# Patient Record
Sex: Female | Born: 1970 | Race: White | Hispanic: No | Marital: Married | Smoking: Never smoker
Health system: Southern US, Community
[De-identification: ages and names within clinical notes are randomized; demographics above are authoritative.]

## PROBLEM LIST (undated history)

## (undated) HISTORY — PX: DILATION AND EVACUATION: SHX1459

## (undated) HISTORY — PX: WISDOM TOOTH EXTRACTION: SHX21

## (undated) HISTORY — PX: BREAST CYST EXCISION: SHX579

---

## 1999-01-22 ENCOUNTER — Other Ambulatory Visit: Admission: RE | Admit: 1999-01-22 | Discharge: 1999-01-22 | Payer: Self-pay | Admitting: Family Medicine

## 2000-01-15 ENCOUNTER — Other Ambulatory Visit: Admission: RE | Admit: 2000-01-15 | Discharge: 2000-01-15 | Payer: Self-pay | Admitting: Family Medicine

## 2000-06-05 ENCOUNTER — Encounter: Payer: Self-pay | Admitting: Family Medicine

## 2000-06-05 ENCOUNTER — Encounter: Admission: RE | Admit: 2000-06-05 | Discharge: 2000-06-05 | Payer: Self-pay | Admitting: Family Medicine

## 2000-12-07 ENCOUNTER — Encounter: Payer: Self-pay | Admitting: Family Medicine

## 2000-12-07 ENCOUNTER — Encounter: Admission: RE | Admit: 2000-12-07 | Discharge: 2000-12-07 | Payer: Self-pay | Admitting: Family Medicine

## 2000-12-18 ENCOUNTER — Ambulatory Visit (HOSPITAL_COMMUNITY): Admission: RE | Admit: 2000-12-18 | Discharge: 2000-12-18 | Payer: Self-pay | Admitting: Obstetrics and Gynecology

## 2000-12-18 ENCOUNTER — Encounter (INDEPENDENT_AMBULATORY_CARE_PROVIDER_SITE_OTHER): Payer: Self-pay | Admitting: Specialist

## 2001-03-01 ENCOUNTER — Other Ambulatory Visit: Admission: RE | Admit: 2001-03-01 | Discharge: 2001-03-01 | Payer: Self-pay | Admitting: Obstetrics and Gynecology

## 2001-06-03 ENCOUNTER — Encounter: Payer: Self-pay | Admitting: Family Medicine

## 2001-06-03 ENCOUNTER — Encounter: Admission: RE | Admit: 2001-06-03 | Discharge: 2001-06-03 | Payer: Self-pay | Admitting: Family Medicine

## 2001-12-31 ENCOUNTER — Inpatient Hospital Stay (HOSPITAL_COMMUNITY): Admission: AD | Admit: 2001-12-31 | Discharge: 2002-01-03 | Payer: Self-pay | Admitting: Obstetrics and Gynecology

## 2002-01-27 ENCOUNTER — Other Ambulatory Visit: Admission: RE | Admit: 2002-01-27 | Discharge: 2002-01-27 | Payer: Self-pay | Admitting: Obstetrics and Gynecology

## 2003-01-17 ENCOUNTER — Other Ambulatory Visit: Admission: RE | Admit: 2003-01-17 | Discharge: 2003-01-17 | Payer: Self-pay | Admitting: Obstetrics and Gynecology

## 2004-02-22 ENCOUNTER — Other Ambulatory Visit: Admission: RE | Admit: 2004-02-22 | Discharge: 2004-02-22 | Payer: Self-pay | Admitting: Obstetrics and Gynecology

## 2005-04-01 ENCOUNTER — Other Ambulatory Visit: Admission: RE | Admit: 2005-04-01 | Discharge: 2005-04-01 | Payer: Self-pay | Admitting: Obstetrics and Gynecology

## 2005-04-11 ENCOUNTER — Encounter: Admission: RE | Admit: 2005-04-11 | Discharge: 2005-04-11 | Payer: Self-pay | Admitting: Obstetrics and Gynecology

## 2006-05-06 ENCOUNTER — Encounter: Admission: RE | Admit: 2006-05-06 | Discharge: 2006-05-06 | Payer: Self-pay | Admitting: Obstetrics and Gynecology

## 2006-06-01 ENCOUNTER — Encounter (INDEPENDENT_AMBULATORY_CARE_PROVIDER_SITE_OTHER): Payer: Self-pay | Admitting: *Deleted

## 2006-06-01 ENCOUNTER — Ambulatory Visit (HOSPITAL_BASED_OUTPATIENT_CLINIC_OR_DEPARTMENT_OTHER): Admission: RE | Admit: 2006-06-01 | Discharge: 2006-06-01 | Payer: Self-pay | Admitting: General Surgery

## 2007-08-18 ENCOUNTER — Inpatient Hospital Stay (HOSPITAL_COMMUNITY): Admission: AD | Admit: 2007-08-18 | Discharge: 2007-08-18 | Payer: Self-pay | Admitting: Obstetrics and Gynecology

## 2007-10-06 ENCOUNTER — Encounter (INDEPENDENT_AMBULATORY_CARE_PROVIDER_SITE_OTHER): Payer: Self-pay | Admitting: Obstetrics and Gynecology

## 2007-10-06 ENCOUNTER — Ambulatory Visit (HOSPITAL_COMMUNITY): Admission: RE | Admit: 2007-10-06 | Discharge: 2007-10-06 | Payer: Self-pay | Admitting: Obstetrics and Gynecology

## 2010-08-22 ENCOUNTER — Encounter: Admission: RE | Admit: 2010-08-22 | Discharge: 2010-08-22 | Payer: Self-pay | Admitting: Obstetrics and Gynecology

## 2011-03-23 IMAGING — MG MM DIGITAL SCREENING
4 series · 4 of 4 positions shown · non-contrast
Comparison: none

DG SCREEN MAMMOGRAM BILATERAL
Bilateral CC and MLO view(s) were taken.

DIGITAL SCREENING MAMMOGRAM WITH CAD:
The breast tissue is extremely dense.  No masses or malignant type calcifications are identified.  
Compared with prior studies.
Images were processed with CAD.

[R CC]
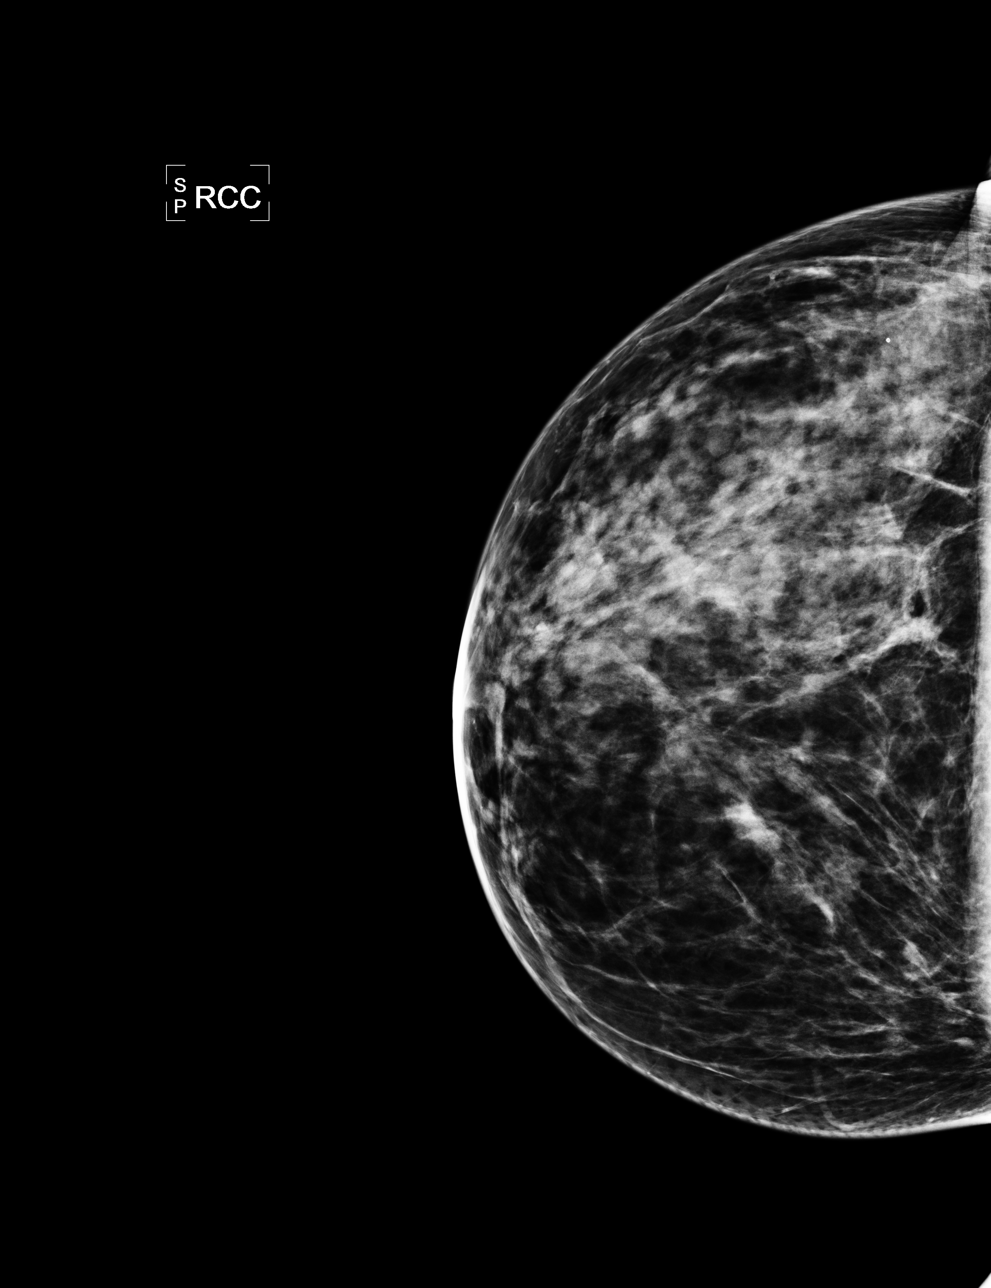

[L CC]
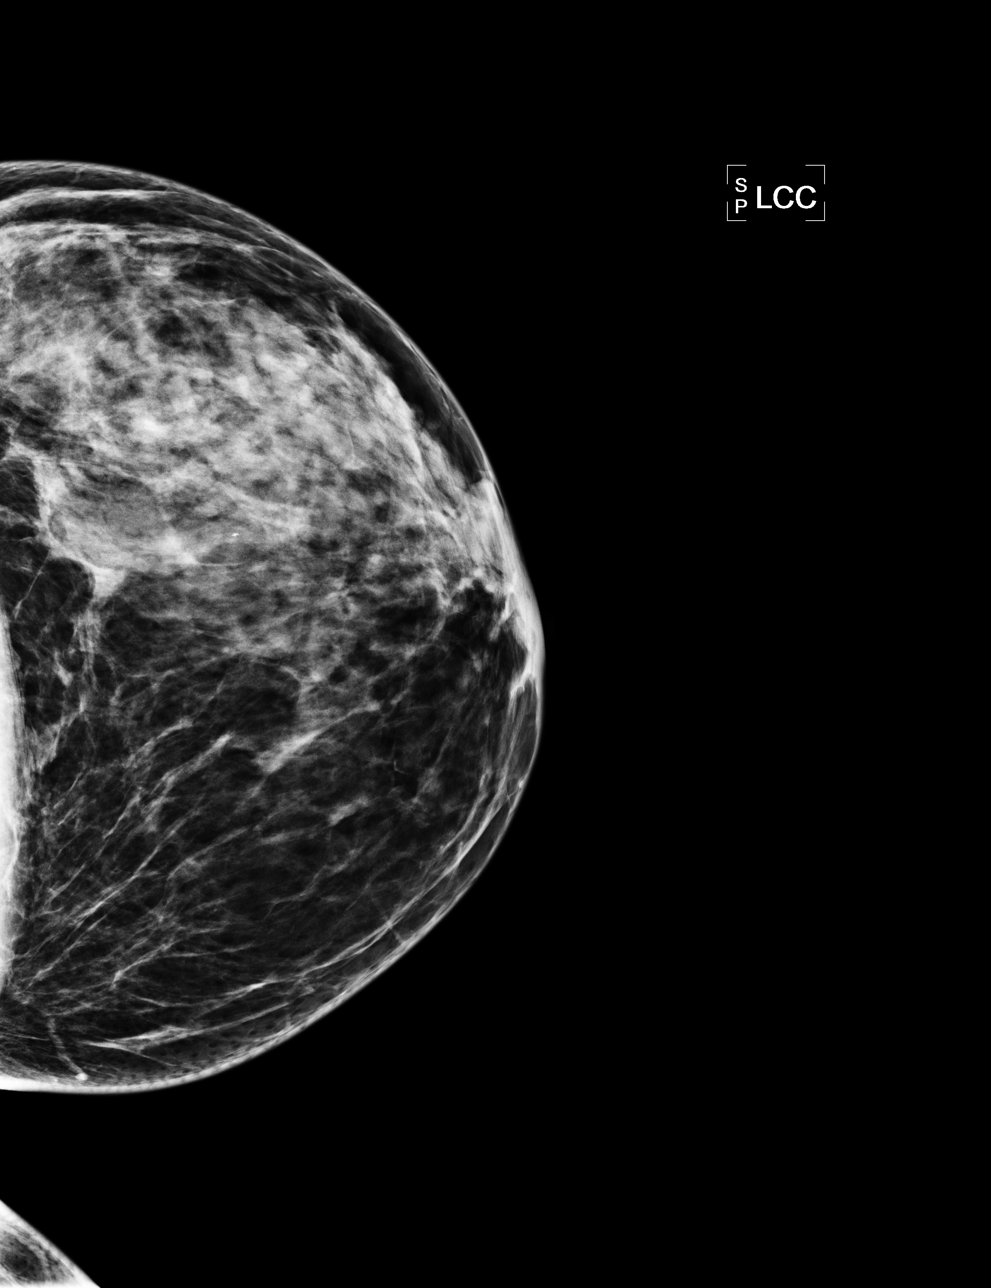

[L MLO]
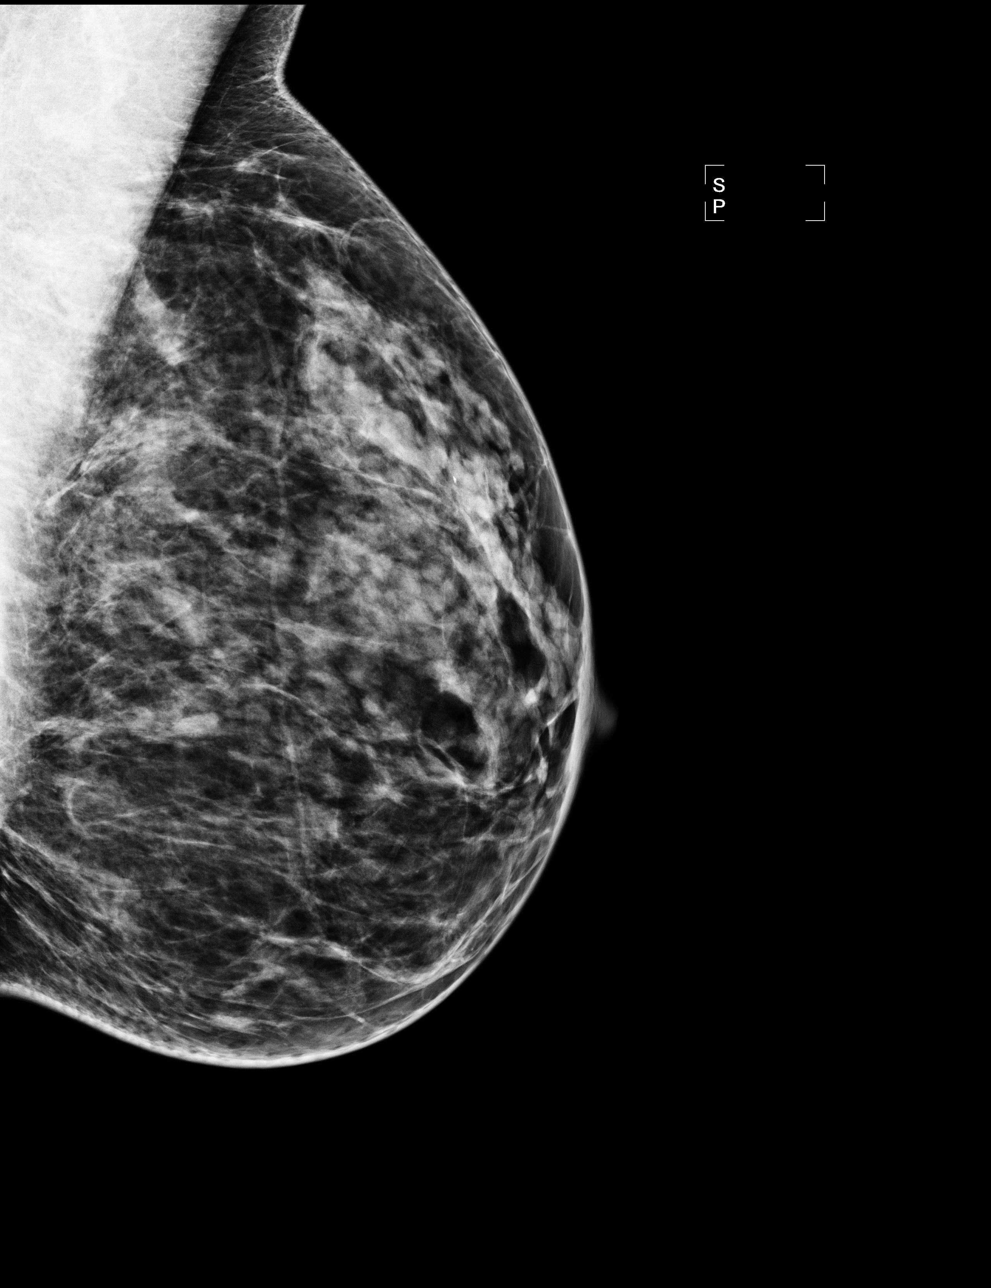

[R MLO]
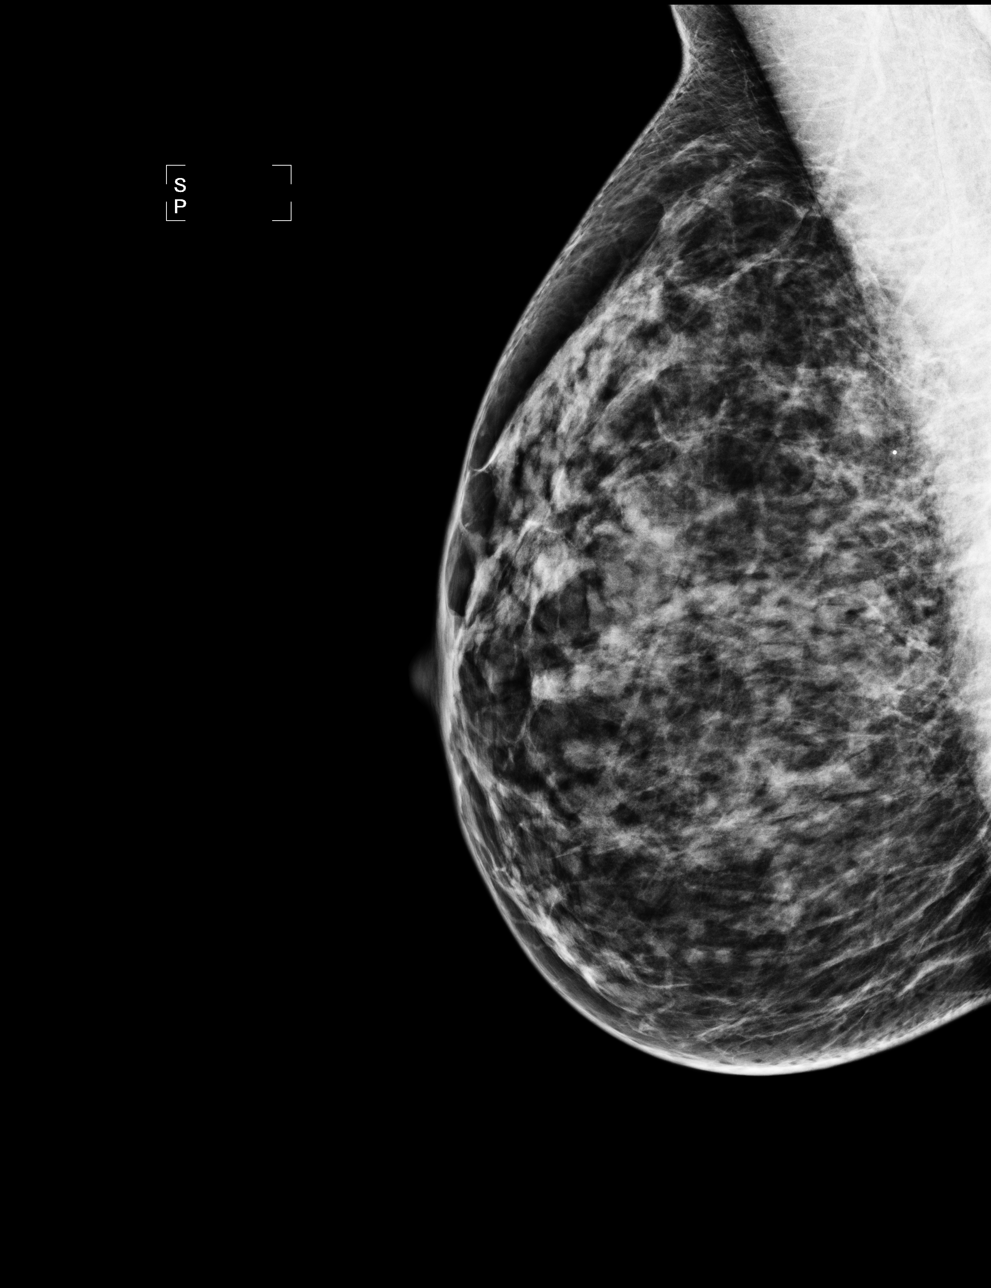

[4 of 4 positions shown; findings below may reference images not displayed]

IMPRESSION: No specific mammographic evidence of malignancy.  Next screening mammogram is recommended in one 
year.

A result letter of this screening mammogram will be mailed directly to the patient.

ASSESSMENT: Negative - BI-RADS 1

Screening mammogram in 1 year.
,

## 2011-04-01 NOTE — Op Note (Signed)
Kristina Lamb, Kristina Lamb              ACCOUNT NO.:  [id number]   MEDICAL RECORD NO.:  [id number]          PATIENT TYPE:  AMB   LOCATION:  SDC                           FACILITY:  [facility]   PHYSICIAN:  Dineen Kid. Rana Snare, M.D.    DATE OF BIRTH:  May 08, 1971   DATE OF PROCEDURE:  10/06/2007  DATE OF DISCHARGE:                               OPERATIVE REPORT   PREOPERATIVE DIAGNOSIS:  Pregnancy at 8 weeks with embryonic demise.   POSTOPERATIVE DIAGNOSIS:  Pregnancy at 8 weeks with embryonic demise.   PROCEDURE:  Dilation and evacuation.   SURGEON:  Dineen Kid. Rana Snare, M.D.   ANESTHESIA:  Monitored anesthetic care and paracervical block.   INDICATIONS:  [name] is a 39 year old G3, P1, pregnancy conceived on  fertility medication, underwent ultrasound last week which showed size  less than dates.  Ultrasound yesterday confirmed embryonic demise with  the fetus measuring 6 1/2 weeks and no fetal heart rate.  The patient  presents today for dilation and evacuation.  The risks and benefits were  discussed and informed consent was obtained. Her blood type is O  positive.   DESCRIPTION OF PROCEDURE:  After adequate analgesia, the patient was  placed in the dorsal lithotomy position.  She is sterilely prepped and  draped.  The bladder was sterilely drained.  A Graves speculum was  placed.  A tenaculum was placed on the anterior lip of the cervix.  A  paracervical block was placed.  1% Xylocaine with 1:100,000 epinephrine,  a total of 20 mL were used.  The uterus sounded to 10 cm, easily dilated  to a #27 Pratt dilator, 8 mm suction curette was inserted.  Products of  conception retrieved.  Curettage performed until a gritty surface was  felt throughout the endometrial cavity and no products of conception  appeared to be remaining. The patient was then given 0.2 mg Methergine  IM with good uterine response.  The curette was then removed.  The  tenaculum was removed from the anterior lip of the cervix and  noted to  be hemostatic.  The patient was then transferred to the recovery room in  stable condition.  Sponge and instrument counts were reported as normal  x3.  Estimated blood loss was minimal.  The patient did receive Toradol  30 mg IV after the procedure.   DISPOSITION:  The patient will be discharged home.  Follow up in the  office in 2-3 weeks.  She is sent home with a routine instruction sheet  for D&E.  She is told to return for increased pain, fever or bleeding.  Also sent home with a prescription for doxycycline 100 mg p.o. b.i.d. x7  days and Methergine 0.2 mg, take every 8 hours for 2 days.      Dineen Kid Rana Snare, M.D.  Electronically Signed     DCL/MEDQ  D:  10/06/2007  T:  10/06/2007  Job:  [id number]

## 2011-04-04 NOTE — Op Note (Signed)
Kristina Lamb, YAHR              ACCOUNT NO.:  [id number]   MEDICAL RECORD NO.:  [id number]          PATIENT TYPE:  AMB   LOCATION:  DSC                          FACILITY:  [facility]   PHYSICIAN:  Ollen Gross. [name], M.D. DATE OF BIRTH:  [birth year]   DATE OF PROCEDURE:  06/01/2006  DATE OF DISCHARGE:                                 OPERATIVE REPORT   PREOPERATIVE DIAGNOSIS:  Right breast mass.   POSTOPERATIVE DIAGNOSIS:  Right breast mass.   PROCEDURE:  Right breast lumpectomy.   SURGEON:  Dr. Carolynne Edouard.   ANESTHESIA:  General via LMA.   PROCEDURE:  After informed consent was obtained, the patient was brought to  the operating room and placed in supine position on the operating table.  After adequate induction of general anesthesia, the patient's right breast  was prepped with Betadine and draped in usual sterile manner.  The patient  had a palpable mass at the 12 o'clock position just at the edge of the  areola.  A small curvilinear incision was made on the superior edge of the  areola overlying the mass.  This incision was carried down through the skin  and subcutaneous tissue sharply with electrocautery.  Once into the fatty  breast tissue, the palpable mass was grasped with an Allis clamp, the mass  was separated from the rest of the fatty breast tissue sharply with  electrocautery.  Once the mass was removed, it was sent to pathology for  further evaluation. Hemostasis was achieved using Bovie electrocautery.  The  wound was irrigated with copious amounts of saline.  The deep layer of the  wound was then closed with interrupted 3-0 Vicryl stitches and the skin was  closed with a running for Monocryl subcuticular stitch. Benzoin, Steri-  Strips and sterile dressings were applied.  The wound was also infiltrated  with a 0.25% Marcaine.  The patient tolerated well.  At the end of the case,  all needle, sponge and instrument counts correct.  The patient was awakened,  taken to recovery  room in stable condition.      Ollen Gross. [name], M.D.  Electronically Signed     PST/MEDQ  D:  06/01/2006  T:  06/01/2006  Job:  [id number]

## 2011-04-04 NOTE — H&P (Signed)
[facility]  Patient:    Kristina Lamb, Kristina Lamb                       MRN: [id number] Attending:  Trevor Iha, M.D.                         History and Physical  DATE OF BIRTH:                10/02/1971  HISTORY OF PRESENT ILLNESS:   Ms. Kristina Lamb is a 40 year old, G1, P0, who presented to the office yesterday with vaginal bleeding and spotting.  She had a positive pregnancy test back at the beginning of December with a beta hCG of 115 on October 27, 2000, making her at least 7 weeks estimated gestational age.  An ultrasound yesterday showed a 5-1/2 week size fetal sac with no fetal pole or heart motion, consistent with missed abortion.  She presents for dilatation and evacuation.  Her estimated date of confinement by last menstrual period would be June 07, 2001.  Blood type is O+.  She is rubella immune.  The hemoglobin is 11.9.  She is HIV negative.  PAST MEDICAL HISTORY:         Negative.  PAST SURGICAL HISTORY:        Negative.  PAST GYNECOLOGICAL HISTORY:   No history of sexually transmitted diseases or abnormal Pap smears.  LABORATORY DATA:              Ultrasound shows a 6 week size uterus with 5-1/2 week size estimated gestational sac and no fetal pole.  PHYSICAL EXAMINATION:         Spotting is mild.  The cervix is close.  IMPRESSION AND PLAN:          Missed abortion, approximately 6 weeks estimated gestational size.  The risks and benefits were discussed at length for expectant management versus D&E.  The patient requests D&E.  Informed consent was obtained. DD:  12/18/00 TD:  12/18/00 Job: [id number]

## 2011-04-04 NOTE — Op Note (Signed)
[facility]  Patient:    LEAR, CARSTENS                       MRN: [id number] Proc. Date: 12/18/00 Adm. Date:  [id number] Disc. Date: [id number] Attending:  Trevor Iha                           Operative Report  PREOPERATIVE DIAGNOSIS:       Missed abortion, six weeks in size.  POSTOPERATIVE DIAGNOSIS:      Missed abortion, six weeks in size.  PROCEDURE:                    Dilation and evacuation.  SURGEON:                      Trevor Iha, M.D.  ANESTHESIA:                   Monitored anesthetic care and paracervical                               block.  INDICATIONS:                  Ms. Vanderloop is a 40 year old, G1, P0, at approximately 7 weeks estimated gestational age by last menstrual period and positive pregnancy test, with ultrasound consistent with approximately 6-week size missed abortion. She presents today for dilation and evacuation. See history and physical for the details. Her blood type is O positive. Informed consent was obtained.  DESCRIPTION OF PROCEDURE:     After adequate analgesia, the patient was placed in the dorsal lithotomy position. She was sterilely prepped and draped. The bladder was sterilely drained. Graves speculum was placed. A paracervical block was placed with 1% Xylocaine with 1:100,000 epinephrine and the anterior cervix was also injected with lidocaine. The uterus was sounded to 8 cm, dilated to a #23 Hegar dilator. A 7 mm suction curet was inserted and products of conception were retrieved. It was performed until the endometrial cavity was felt to be empty of the products of conception as evidenced by a gritty surface felt around the endometrial cavity, followed by a gentle, sharp curettage retrieving no retained products in a second pass of suction curettage reassuring the endometrial cavity was empty. At this time, the curet was removed. The tenaculum was removed from the cervix. A small  cervical laceration was noted from the tenaculum and was repaired with a figure-of-eight of 0 chromic. Good hemostasis was achieved. At this time, the speculum was removed. The patient tolerated the procedure well, was stable on transfer to recovery room. Sponge and instrument count was normal x 3. Estimated blood loss during the procedure was 10 cc.  DISPOSITION:                  The patient is to be discharged home with followup in the office in two to three weeks. She was sent home with a prescription for doxycycline 100 mg p.o. b.i.d. x 7 days and Methergine 0.2 mg to take every eight hours for two days. DD:  12/18/00 TD:  12/20/00 Job: [id number]

## 2011-08-26 LAB — CBC
HCT: 36.7
Hemoglobin: 12.7
MCHC: 34.7
Platelets: 250
RDW: 12.9

## 2019-02-15 ENCOUNTER — Ambulatory Visit (INDEPENDENT_AMBULATORY_CARE_PROVIDER_SITE_OTHER): Payer: BC Managed Care – PPO | Admitting: Psychology

## 2019-02-15 DIAGNOSIS — F4323 Adjustment disorder with mixed anxiety and depressed mood: Secondary | ICD-10-CM | POA: Diagnosis not present

## 2019-02-28 ENCOUNTER — Ambulatory Visit (INDEPENDENT_AMBULATORY_CARE_PROVIDER_SITE_OTHER): Payer: BC Managed Care – PPO | Admitting: Psychology

## 2019-02-28 DIAGNOSIS — F4323 Adjustment disorder with mixed anxiety and depressed mood: Secondary | ICD-10-CM | POA: Diagnosis not present

## 2019-03-14 ENCOUNTER — Ambulatory Visit (INDEPENDENT_AMBULATORY_CARE_PROVIDER_SITE_OTHER): Payer: BC Managed Care – PPO | Admitting: Psychology

## 2019-03-14 DIAGNOSIS — F4323 Adjustment disorder with mixed anxiety and depressed mood: Secondary | ICD-10-CM

## 2019-03-29 ENCOUNTER — Ambulatory Visit (INDEPENDENT_AMBULATORY_CARE_PROVIDER_SITE_OTHER): Payer: BC Managed Care – PPO | Admitting: Psychology

## 2019-03-29 DIAGNOSIS — F4323 Adjustment disorder with mixed anxiety and depressed mood: Secondary | ICD-10-CM

## 2019-04-12 ENCOUNTER — Ambulatory Visit (INDEPENDENT_AMBULATORY_CARE_PROVIDER_SITE_OTHER): Payer: BC Managed Care – PPO | Admitting: Psychology

## 2019-04-12 DIAGNOSIS — F4323 Adjustment disorder with mixed anxiety and depressed mood: Secondary | ICD-10-CM

## 2019-05-11 ENCOUNTER — Ambulatory Visit (INDEPENDENT_AMBULATORY_CARE_PROVIDER_SITE_OTHER): Payer: BC Managed Care – PPO | Admitting: Psychology

## 2019-05-11 DIAGNOSIS — F4323 Adjustment disorder with mixed anxiety and depressed mood: Secondary | ICD-10-CM | POA: Diagnosis not present

## 2019-06-16 ENCOUNTER — Ambulatory Visit: Payer: BC Managed Care – PPO | Admitting: Psychology

## 2019-07-05 ENCOUNTER — Ambulatory Visit (INDEPENDENT_AMBULATORY_CARE_PROVIDER_SITE_OTHER): Payer: BC Managed Care – PPO | Admitting: Psychology

## 2019-07-05 DIAGNOSIS — F4323 Adjustment disorder with mixed anxiety and depressed mood: Secondary | ICD-10-CM

## 2019-07-27 ENCOUNTER — Ambulatory Visit (INDEPENDENT_AMBULATORY_CARE_PROVIDER_SITE_OTHER): Payer: BC Managed Care – PPO | Admitting: Psychology

## 2019-07-27 DIAGNOSIS — F4323 Adjustment disorder with mixed anxiety and depressed mood: Secondary | ICD-10-CM | POA: Diagnosis not present

## 2019-08-29 ENCOUNTER — Ambulatory Visit (INDEPENDENT_AMBULATORY_CARE_PROVIDER_SITE_OTHER): Payer: BC Managed Care – PPO | Admitting: Psychology

## 2019-08-29 DIAGNOSIS — F4323 Adjustment disorder with mixed anxiety and depressed mood: Secondary | ICD-10-CM

## 2020-01-12 ENCOUNTER — Ambulatory Visit: Payer: Self-pay

## 2020-01-12 ENCOUNTER — Ambulatory Visit: Payer: BC Managed Care – PPO | Attending: Internal Medicine

## 2020-01-12 DIAGNOSIS — Z23 Encounter for immunization: Secondary | ICD-10-CM | POA: Insufficient documentation

## 2020-01-12 NOTE — Progress Notes (Signed)
   Covid-19 Vaccination Clinic  Name:  Kristina Lamb    MRN: [id number] DOB: 1971/05/25  01/12/2020  Kristina Lamb was observed post Covid-19 immunization for 15 minutes without incidence. She was provided with Vaccine Information Sheet and instruction to access the V-Safe system.   Kristina Lamb was instructed to call 911 with any severe reactions post vaccine: Marland Kitchen Difficulty breathing  . Swelling of your face and throat  . A fast heartbeat  . A bad rash all over your body  . Dizziness and weakness    Immunizations Administered    Name Date Dose VIS Date Route   Pfizer COVID-19 Vaccine 01/12/2020  4:24 PM 0.3 mL 10/28/2019 Intramuscular   Manufacturer: [employer], [employer]   Lot: [id number]   NDC: [address]-4

## 2020-02-07 ENCOUNTER — Ambulatory Visit: Payer: BC Managed Care – PPO | Attending: Internal Medicine

## 2020-02-07 DIAGNOSIS — Z23 Encounter for immunization: Secondary | ICD-10-CM

## 2020-02-07 NOTE — Progress Notes (Signed)
   Covid-19 Vaccination Clinic  Name:  FLORETTA PETRO    MRN: [id number] DOB: [birth year]  02/07/2020  Ms. Applegate was observed post Covid-19 immunization for 15 minutes without incident. She was provided with Vaccine Information Sheet and instruction to access the V-Safe system.   Ms. Pescador was instructed to call 911 with any severe reactions post vaccine: Marland Kitchen Difficulty breathing  . Swelling of face and throat  . A fast heartbeat  . A bad rash all over body  . Dizziness and weakness   Immunizations Administered    Name Date Dose VIS Date Route   Pfizer COVID-19 Vaccine 02/07/2020  3:32 PM 0.3 mL 10/28/2019 Intramuscular   Manufacturer: [employer], [employer]   Lot: [id number]   NDC: [address]-2

## 2021-11-01 ENCOUNTER — Encounter: Payer: Self-pay | Admitting: Internal Medicine

## 2021-11-01 ENCOUNTER — Ambulatory Visit (AMBULATORY_SURGERY_CENTER): Payer: BC Managed Care – PPO | Admitting: *Deleted

## 2021-11-01 ENCOUNTER — Other Ambulatory Visit: Payer: Self-pay

## 2021-11-01 VITALS — Ht 62.0 in | Wt 118.0 lb

## 2021-11-01 DIAGNOSIS — Z1211 Encounter for screening for malignant neoplasm of colon: Secondary | ICD-10-CM

## 2021-11-01 MED ORDER — NA SULFATE-K SULFATE-MG SULF 17.5-3.13-1.6 GM/177ML PO SOLN: 1.0000 | ORAL | 0 refills | Status: DC

## 2021-11-01 NOTE — Progress Notes (Signed)
Patient's pre-visit was done today over the phone with the patient. Name,DOB and address verified. Patient denies any allergies to Eggs and Soy. Patient denies any problems with anesthesia/sedation. Patient is not taking any diet pills or blood thinners. No home Oxygen. Packet of Prep instructions mailed to patient including a copy of a consent form-pt is aware. Patient understands to call us back with any questions or concerns. Patient is aware of our care-partner policy and Covid-19 safety protocol.  The patient is COVID-19 vaccinated.   

## 2021-11-15 ENCOUNTER — Ambulatory Visit (AMBULATORY_SURGERY_CENTER): Payer: BC Managed Care – PPO | Admitting: Internal Medicine

## 2021-11-15 ENCOUNTER — Encounter: Payer: Self-pay | Admitting: Internal Medicine

## 2021-11-15 VITALS — BP 112/67 | HR 58 | Temp 98.9°F | Resp 17 | Ht 62.0 in | Wt 118.0 lb

## 2021-11-15 DIAGNOSIS — Z1211 Encounter for screening for malignant neoplasm of colon: Secondary | ICD-10-CM

## 2021-11-15 MED ORDER — SODIUM CHLORIDE 0.9 % IV SOLN: 500.0000 mL | Freq: Once | INTRAVENOUS | Status: DC

## 2021-11-15 NOTE — Progress Notes (Signed)
Sedate, gd SR, tolerated procedure well, VSS, report to RN 

## 2021-11-15 NOTE — Progress Notes (Signed)
GASTROENTEROLOGY PROCEDURE H&P NOTE   Primary Care Physician: Cari Caraway, MD    Reason for Procedure:   Colon cancer screening  Plan:    Colonoscopy  Patient is appropriate for endoscopic procedure(s) in the ambulatory ([place]) setting.  The nature of the procedure, as well as the risks, benefits, and alternatives were carefully and thoroughly reviewed with the patient. Ample time for discussion and questions allowed. The patient understood, was satisfied, and agreed to proceed.     HPI: Kristina Lamb is a 50 y.o. female who presents for colonoscopy for colon cancer screening. This is her first colonoscopy. Denies blood in stools, changes in bowel habits, weight loss. Denies fam hx of colon cancer.  No past medical history on file.  Past Surgical History:  Procedure Laterality Date   BREAST CYST EXCISION     DILATION AND EVACUATION     x2   WISDOM TOOTH EXTRACTION      Prior to Admission medications   Medication Sig Start Date End Date Taking? Authorizing Provider  Na Sulfate-K Sulfate-Mg Sulf 17.5-3.13-1.6 GM/177ML SOLN Take 1 kit by mouth as directed. May use generic Suprep 11/01/21   Sharyn Creamer, MD  Norgestimate-Ethinyl Estradiol Triphasic 0.18/0.215/0.25 MG-35 MCG tablet Tri-Sprintec (28) 0.18 mg(7)/0.215 mg(7)/0.25 mg(7)-35 mcg tablet  TAKE 1 TABLET BY MOUTH EVERY DAY    [provider]  VITAMIN D PO Take by mouth.    [provider]    Current Outpatient Medications  Medication Sig Dispense Refill   Na Sulfate-K Sulfate-Mg Sulf 17.5-3.13-1.6 GM/177ML SOLN Take 1 kit by mouth as directed. May use generic Suprep 354 mL 0   Norgestimate-Ethinyl Estradiol Triphasic 0.18/0.215/0.25 MG-35 MCG tablet Tri-Sprintec (28) 0.18 mg(7)/0.215 mg(7)/0.25 mg(7)-35 mcg tablet  TAKE 1 TABLET BY MOUTH EVERY DAY     VITAMIN D PO Take by mouth.     Current Facility-Administered Medications  Medication Dose Route Frequency Provider Last Rate Last Admin    0.9 %  sodium chloride infusion  500 mL Intravenous Once Sharyn Creamer, MD        Allergies as of 11/15/2021   (No Known Allergies)    Family History  Problem Relation Age of Onset   Colon cancer Neg Hx     Social History   Socioeconomic History   Marital status: Married    Spouse name: Not on file   Number of children: Not on file   Years of education: Not on file   Highest education level: Not on file  Occupational History   Not on file  Tobacco Use   Smoking status: Never   Smokeless tobacco: Never  Substance and Sexual Activity   Alcohol use: Not Currently    Alcohol/week: 4.0 standard drinks    Types: 4 Glasses of wine per week   Drug use: Not Currently   Sexual activity: Not on file  Other Topics Concern   Not on file  Social History Narrative   Not on file   Social Determinants of Health   Financial Resource Strain: Not on file  Food Insecurity: Not on file  Transportation Needs: Not on file  Physical Activity: Not on file  Stress: Not on file  Social Connections: Not on file  Intimate Partner Violence: Not on file    Physical Exam: Vital signs in last 24 hours: BP 100/71    Pulse 63    Temp 98.9 F (37.2 C) (Temporal)    Ht _0  (1.575 m)  Wt 118 lb (53.5 kg)    SpO2 99%    BMI 21.58 kg/m  GEN: NAD EYE: Sclerae anicteric ENT: MMM CV: Non-tachycardic Pulm: No increased work of breathing GI: Soft, NT/ND NEURO:  Alert & Oriented   Christia Reading, MD [facility]  11/15/2021 8:37 AM

## 2021-11-15 NOTE — Op Note (Signed)
[facility] Patient Name: Kristina Lamb Procedure Date: 11/15/2021 9:18 AM MRN: [id number] Endoscopist: Nicole Kindred "Kristina Lamb ,  Age: 50 Referring MD:  Date of Birth: 03-31-1971 Gender: Female Account #: [id number] Procedure:                Colonoscopy Indications:              Screening for colorectal malignant neoplasm, This                            is the patient's first colonoscopy Medicines:                Monitored Anesthesia Care Procedure:                Pre-Anesthesia Assessment:                           - Prior to the procedure, a History and Physical                            was performed, and patient medications and                            allergies were reviewed. The patient's tolerance of                            previous anesthesia was also reviewed. The risks                            and benefits of the procedure and the sedation                            options and risks were discussed with the patient.                            All questions were answered, and informed consent                            was obtained. Prior Anticoagulants: The patient has                            taken no previous anticoagulant or antiplatelet                            agents. ASA Grade Assessment: II - A patient with                            mild systemic disease. After reviewing the risks                            and benefits, the patient was deemed in                            satisfactory condition to undergo the procedure.  After obtaining informed consent, the colonoscope                            was passed under direct vision. Throughout the                            procedure, the patient's blood pressure, pulse, and                            oxygen saturations were monitored continuously. The                            CF [id number] #[id number] was introduced through the anus                            and advanced to the  the terminal ileum. The                            colonoscopy was performed without difficulty. The                            patient tolerated the procedure well. The quality                            of the bowel preparation was adequate. The terminal                            ileum, ileocecal valve, appendiceal orifice, and                            rectum were photographed. Scope In: 9:23:39 AM Scope Out: 9:47:03 AM Scope Withdrawal Time: 0 hours 11 minutes 23 seconds  Total Procedure Duration: 0 hours 23 minutes 24 seconds  Findings:                 The terminal ileum appeared normal.                           Non-bleeding internal hemorrhoids were found during                            retroflexion.                           The exam was otherwise without abnormality. Complications:            No immediate complications. Estimated Blood Loss:     Estimated blood loss: none. Impression:               - The examined portion of the ileum was normal.                           - Non-bleeding internal hemorrhoids.                           - The examination was otherwise normal.                           -  No specimens collected. Recommendation:           - Discharge patient to home (with escort).                           - Repeat colonoscopy in 10 years for screening                            purposes.                           - The findings and recommendations were discussed                            with the patient. 1 South Gonzales StreetEulah Lamb,  11/15/2021 9:49:34 AM

## 2021-11-15 NOTE — Patient Instructions (Signed)
YOU HAD AN ENDOSCOPIC PROCEDURE TODAY AT THE [facility]:   Refer to the procedure report that was given to you for any specific questions about what was found during the examination.  If the procedure report does not answer your questions, please call your gastroenterologist to clarify.  If you requested that your care partner not be given the details of your procedure findings, then the procedure report has been included in a sealed envelope for you to review at your convenience later.  YOU SHOULD EXPECT: Some feelings of bloating in the abdomen. Passage of more gas than usual.  Walking can help get rid of the air that was put into your GI tract during the procedure and reduce the bloating. If you had a lower endoscopy (such as a colonoscopy or flexible sigmoidoscopy) you may notice spotting of blood in your stool or on the toilet paper. If you underwent a bowel prep for your procedure, you may not have a normal bowel movement for a few days.  Please Note:  You might notice some irritation and congestion in your nose or some drainage.  This is from the oxygen used during your procedure.  There is no need for concern and it should clear up in a day or so.  SYMPTOMS TO REPORT IMMEDIATELY:   Following lower endoscopy (colonoscopy or flexible sigmoidoscopy):  Excessive amounts of blood in the stool  Significant tenderness or worsening of abdominal pains  Swelling of the abdomen that is new, acute  Fever of 100F or higher  For urgent or emergent issues, a gastroenterologist can be reached at any hour by calling [phone]. Do not use MyChart messaging for urgent concerns.    DIET:  We do recommend a small meal at first, but then you may proceed to your regular diet.  Drink plenty of fluids but you should avoid alcoholic beverages for 24 hours.  ACTIVITY:  You should plan to take it easy for the rest of today and you should NOT DRIVE or use heavy machinery until tomorrow (because  of the sedation medicines used during the test).    FOLLOW UP: Our staff will call the number listed on your records 48-72 hours following your procedure to check on you and address any questions or concerns that you may have regarding the information given to you following your procedure. If we do not reach you, we will leave a message.  We will attempt to reach you two times.  During this call, we will ask if you have developed any symptoms of COVID 19. If you develop any symptoms (ie: fever, flu-like symptoms, shortness of breath, cough etc.) before then, please call [phone].  If you test positive for Covid 19 in the 2 weeks post procedure, please call and report this information to us.    If any biopsies were taken you will be contacted by phone or by letter within the next 1-3 weeks.  Please call us at [phone] if you have not heard about the biopsies in 3 weeks.    SIGNATURES/CONFIDENTIALITY: You and/or your care partner have signed paperwork which will be entered into your electronic medical record.  These signatures attest to the fact that that the information above on your After Visit Summary has been reviewed and is understood.  Full responsibility of the confidentiality of this discharge information lies with you and/or your care-partner. 

## 2021-11-15 NOTE — Progress Notes (Signed)
CW - VS   

## 2021-11-21 ENCOUNTER — Telehealth: Payer: Self-pay | Admitting: *Deleted

## 2021-11-21 NOTE — Telephone Encounter (Signed)
°  Follow up Call-  Call back number 11/15/2021  Post procedure Call Back phone  # [phone]  Permission to leave phone message Yes  Some recent data might be hidden    [facility]

## 2021-11-21 NOTE — Telephone Encounter (Signed)
No answer for post procedure follow up. Left VM. °
